# Patient Record
Sex: Male | Born: 1990 | Race: White | Hispanic: No | Marital: Single | Smoking: Never smoker
Health system: Southern US, Community
[De-identification: ages and names within clinical notes are randomized; demographics above are authoritative.]

---
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
## 2019-03-31 ENCOUNTER — Emergency Department (HOSPITAL_COMMUNITY)
Admission: EM | Admit: 2019-03-31 | Discharge: 2019-03-31 | Disposition: A | Discharge status: Home / Self Care | Payer: No Typology Code available for payment source | Attending: Emergency Medicine | Admitting: Emergency Medicine

## 2019-03-31 ENCOUNTER — Emergency Department (HOSPITAL_COMMUNITY): Payer: No Typology Code available for payment source

## 2019-03-31 ENCOUNTER — Encounter (HOSPITAL_COMMUNITY): Payer: Self-pay | Admitting: Obstetrics and Gynecology

## 2019-03-31 ENCOUNTER — Other Ambulatory Visit: Payer: Self-pay

## 2019-03-31 DIAGNOSIS — Y990 Civilian activity done for income or pay: Secondary | ICD-10-CM | POA: Diagnosis not present

## 2019-03-31 DIAGNOSIS — Y9289 Other specified places as the place of occurrence of the external cause: Secondary | ICD-10-CM | POA: Diagnosis not present

## 2019-03-31 DIAGNOSIS — Y9389 Activity, other specified: Secondary | ICD-10-CM | POA: Insufficient documentation

## 2019-03-31 DIAGNOSIS — W231XXA Caught, crushed, jammed, or pinched between stationary objects, initial encounter: Secondary | ICD-10-CM | POA: Insufficient documentation

## 2019-03-31 DIAGNOSIS — M79641 Pain in right hand: Secondary | ICD-10-CM | POA: Diagnosis not present

## 2019-03-31 NOTE — ED Triage Notes (Signed)
Patient reports he crushed his right hand between a gun safe.

## 2019-03-31 NOTE — ED Provider Notes (Signed)
[facility] DEPT Provider Note   CSN: [id number] Arrival date & time: 03/31/19  1534     History Chief Complaint  Patient presents with  . Hand Pain    Adam Wells is a 29 y.o. male.  29 y.o male with no PMH to the ED with a chief complaint of right hand pain status post injury.  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  He reports pain along the thenar region, this is worse with movement of the right thumb.  He has not tried any medication for improvement in his symptoms.  There is no obvious deformity or laceration present on his wound.  No alleviating factors.  No other trauma, weakness, or tingling sensation.   The history is provided by the patient.       History reviewed. No pertinent past medical history.  There are no problems to display for this patient.   History reviewed. No pertinent surgical history.     No family history on file.  Social History   Tobacco Use  . Smoking status: Never Smoker  . Smokeless tobacco: Never Used  Substance Use Topics  . Alcohol use: Not Currently  . Drug use: Not Currently    Home Medications Prior to Admission medications   Not on File    Allergies    Patient has no known allergies.  Review of Systems   Review of Systems  Constitutional: Negative for fever.  Musculoskeletal: Positive for arthralgias.    Physical Exam Updated Vital Signs BP (!) 142/93   Pulse 64   Temp 98.2 F (36.8 C) (Oral)   Resp 16   SpO2 100%   Physical Exam Vitals and nursing note reviewed.  Constitutional:      Appearance: He is well-developed.  HENT:     Head: Normocephalic and atraumatic.  Eyes:     General: No scleral icterus.    Pupils: Pupils are equal, round, and reactive to light.  Cardiovascular:     Heart sounds: Normal heart sounds.  Pulmonary:     Effort: Pulmonary  effort is normal.     Breath sounds: Normal breath sounds. No wheezing.  Chest:     Chest wall: No tenderness.  Abdominal:     General: Bowel sounds are normal. There is no distension.     Palpations: Abdomen is soft.     Tenderness: There is no abdominal tenderness.  Musculoskeletal:        General: No deformity.     Right hand: Tenderness and bony tenderness present. Normal range of motion. Normal strength. Normal sensation. There is no disruption of two-point discrimination. Normal capillary refill. Normal pulse.     Cervical back: Normal range of motion.     Comments: Pulses present, capillary refill is intact.  There is pain with palpation of the thenar region.  Pain with movement of the right thumb.  No snuffbox tenderness.  Skin:    General: Skin is warm and dry.  Neurological:     Mental Status: He is alert and oriented to person, place, and time.     ED Results / Procedures / Treatments   Labs (all labs ordered are listed, but only abnormal results are displayed) Labs Reviewed - No data to display  EKG None  Radiology DG Hand Complete Right  Result Date: 03/31/2019 CLINICAL  DATA:  Right hand pain/injury EXAM: RIGHT HAND - COMPLETE 3+ VIEW COMPARISON:  None. FINDINGS: No fracture or dislocation is seen. The joint spaces are preserved. Visualized soft tissues are within normal limits. IMPRESSION: Negative. Electronically Signed   By: Charline Bills M.D.   On: 03/31/2019 16:31    Procedures Procedures (including critical care time)  Medications Ordered in ED Medications - No data to display  ED Course  I have reviewed the triage vital signs and the nursing notes.  Pertinent labs & imaging results that were available during my care of the patient were reviewed by me and considered in my medical decision making (see chart for details).    MDM Rules/Calculators/A&P    Patient with a pertinent past medical history presented to ED status post right hand injury  while at work.  Patient reports pain along the thenar region, this is worse with flexion along with movement of his right thumb.  No snuffbox tenderness.  Pulses are present, capillary refill is intact.  He is neurovascularly intact.  Interpretation of his right hand x-ray showed no fracture, dislocation, soft tissue swelling.  Suspect a likely bruised from prior injury without any obvious deformity or laceration noted.  Will be placed on a Velcro splint to help with his symptoms.  He will benefit from some anti-inflammatories to help with his symptoms.  Return precautions discussed at length.  Patient understands and agrees to management.  Portions of this note were generated with Scientist, clinical (histocompatibility and immunogenetics). Dictation errors may occur despite best attempts at proofreading. Final Clinical Impression(s) / ED Diagnoses Final diagnoses:  Right hand pain    Rx / DC Orders ED Discharge Orders    None       Claude Manges, PA-C 03/31/19 1706    Sabas Sous, MD 04/01/19 [phone]

## 2019-03-31 NOTE — Discharge Instructions (Addendum)
Your x-ray today did not show any fracture or dislocation.  You may take some over-the-counter Aleve to help with your symptoms, this is an time for amatory, please take this with food.  Keep your right hand elevated along with place ice to the area which should help with your symptoms.

## 2019-06-12 ENCOUNTER — Other Ambulatory Visit: Payer: Self-pay

## 2019-06-12 ENCOUNTER — Ambulatory Visit (INDEPENDENT_AMBULATORY_CARE_PROVIDER_SITE_OTHER): Payer: Self-pay | Admitting: Psychiatry

## 2019-06-12 ENCOUNTER — Encounter: Payer: Self-pay | Admitting: Psychiatry

## 2019-06-12 VITALS — BP 139/78 | HR 59 | Ht 72.0 in | Wt 310.0 lb

## 2019-06-12 DIAGNOSIS — F410 Panic disorder [episodic paroxysmal anxiety] without agoraphobia: Secondary | ICD-10-CM

## 2019-06-12 DIAGNOSIS — F4010 Social phobia, unspecified: Secondary | ICD-10-CM

## 2019-06-12 MED ORDER — LORAZEPAM 0.5 MG PO TABS: ORAL_TABLET | ORAL | 1 refills | Status: DC

## 2019-06-12 NOTE — Progress Notes (Signed)
Adam Wells [id number] 1990/11/14 29 y.o.  Subjective:   Patient ID:  Adam Wells is a 29 y.o. (DOB 1990-03-02) male.  Chief Complaint:  Chief Complaint  Patient presents with  . Panic Attack  . Anxiety    HPI Adam Wells presents to the office today for follow-up of anxiety.  Patient was last seen in office on July 11, 2017.  He reports that since last visit he has not had any significant anxiety or depressive s/s until recently.   He reports that he took a new job last week and had recurrence of anxiety.  He reports having severe anxiety and panic on his second day of work and was not able to work last Thursday, Friday, and this Monday due to anxiety.  He reports that last Wednesday was the worst period of panic and felt frozen and unable to get up "but felt all this energy moving through me." He reports that he has had worry and panic since starting new job last week to include chest tightness and stomach pain. He reports that he has had difficulty getting to work and eating breakfast. He reports that "yesterday seemed to be kind of a turning point" and was able to get up and get moving. He was able to stay the entire day.    He reports that he had some anxiety this morning without panic. He reports that he is less anxious today because he has an idea of what his day looks like. Has had negative job experiences in the past. 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 Reports that he prefers hand on tasks instead of sitting around or dealing with customers. He reports that on weekends he prefers to be moving and doing something with his hands. 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   He reports that he had some mild depression last week "because I was so frustrated with myself." He reports that he is sleeping well because he is physically tired at night. Had difficulty falling asleep one night last week. Appetite was decreased all  last week and appetite has been returning this week. Motivation has been low the last 1-2 weeks, even for things that he really enjoys. Energy has been "medium...mellow." Concentration has varied. Reports that some days he is sitting on the couch and not watching what is on TV and other days concentration is ok. Overall concentration has been less. Denies SI.   He reports that he has never liked large gatherings. His mother has told him that as a child he never wanted a birthday party and did not want to attend birthday parties. Has some anxiety around people he does not know well. He does not like larger gatherings of people he knows well.   Works for [employer]." Has been with girlfriend almost 7 years. She lives in [place] and he lives in the [place] area.   Past Psychiatric Medication Trials: Lexapro- Minimal improvement. Jitteriness at 15 mg po qd Trintellix Lorazepam    Review of Systems:  Review of Systems  Musculoskeletal: Negative for gait problem.  Neurological: Negative for tremors.  Psychiatric/Behavioral:       Please refer to HPI    Medications: I have reviewed the patient's current medications.  Current Outpatient Medications  Medication Sig Dispense Refill  . LORazepam (ATIVAN) 0.5 MG tablet Take 1/2-1 tab po TID prn anxiety 90 tablet 1   No current facility-administered medications  for this visit.    Medication Side Effects: Other: N/A  Allergies: No Known Allergies  History reviewed. No pertinent past medical history.  History reviewed. No pertinent family history.  Social History   Socioeconomic History  . Marital status: Single    Spouse name: Not on file  . Number of children: Not on file  . Years of education: Not on file  . Highest education level: Not on file  Occupational History  . Not on file  Tobacco Use  . Smoking status: Never Smoker  . Smokeless tobacco: Never Used  Substance and Sexual Activity  . Alcohol use: Not Currently   . Drug use: Not Currently  . Sexual activity: Yes  Other Topics Concern  . Not on file  Social History Narrative  . Not on file   Social Determinants of Health   Financial Resource Strain:   . Difficulty of Paying Living Expenses:   Food Insecurity:   . Worried About Programme researcher, broadcasting/film/video in the Last Year:   . Barista in the Last Year:   Transportation Needs:   . Freight forwarder (Medical):   Marland Kitchen Lack of Transportation (Non-Medical):   Physical Activity:   . Days of Exercise per Week:   . Minutes of Exercise per Session:   Stress:   . Feeling of Stress :   Social Connections:   . Frequency of Communication with Friends and Family:   . Frequency of Social Gatherings with Friends and Family:   . Attends Religious Services:   . Active Member of Clubs or Organizations:   . Attends Banker Meetings:   Marland Kitchen Marital Status:   Intimate Partner Violence:   . Fear of Current or Ex-Partner:   . Emotionally Abused:   Marland Kitchen Physically Abused:   . Sexually Abused:     Past Medical History, Surgical history, Social history, and Family history were reviewed and updated as appropriate.   Please see review of systems for further details on the patient's review from today.   Objective:   Physical Exam:  BP 139/78   Pulse (!) 59   Ht 6' (1.829 m)   Wt (!) 310 lb (140.6 kg)   BMI 42.04 kg/m   Physical Exam Constitutional:      General: He is not in acute distress. Musculoskeletal:        General: No deformity.  Neurological:     Mental Status: He is alert and oriented to person, place, and time.     Coordination: Coordination normal.  Psychiatric:        Attention and Perception: Attention and perception normal. He does not perceive auditory or visual hallucinations.        Mood and Affect: Mood is anxious. Mood is not depressed. Affect is not labile, blunt, angry or inappropriate.        Speech: Speech normal.        Behavior: Behavior normal.         Thought Content: Thought content normal. Thought content is not paranoid or delusional. Thought content does not include homicidal or suicidal ideation. Thought content does not include homicidal or suicidal plan.        Cognition and Memory: Cognition and memory normal.        Judgment: Judgment normal.     Comments: Insight intact     Lab Review:  No results found for: NA, K, CL, CO2, GLUCOSE, BUN, CREATININE, CALCIUM, PROT, ALBUMIN, AST, ALT, ALKPHOS,  BILITOT, GFRNONAA, GFRAA  No results found for: WBC, RBC, HGB, HCT, PLT, MCV, MCH, MCHC, RDW, LYMPHSABS, MONOABS, EOSABS, BASOSABS  No results found for: POCLITH, LITHIUM   No results found for: PHENYTOIN, PHENOBARB, VALPROATE, CBMZ   .res Assessment: Plan:   Discussed possible treatment options with patient, to include starting a maintenance medication or using lorazepam short-term for acute anxiety.  Reviewed past history and that patient has had adverse effects and minimal improvement with both Lexapro and Trintellix, and anxiety and panic improved after starting brief course of lorazepam and mood and anxiety signs and symptoms have been in remission for almost 2 years without any medication.  Discussed therefore, restarting lorazepam for anxiety and panic.  Discussed that he could take lorazepam 1/2 to 1 tablet 3 times daily over the next several days until anxiety stabilizes and then could transition to taking lorazepam only in the morning, which is when his anxiety and panic is typically the most severe, and then transitioning to using lorazepam only as needed. Discussed potential benefits, risk, and side effects of benzodiazepines to include potential risk of tolerance and dependence, as well as possible drowsiness.  Advised patient not to drive if experiencing drowsiness and to take lowest possible effective dose to minimize risk of dependence and tolerance. Patient agrees to trial of lorazepam.  Will start lorazepam 0.5 mg 1/2 to 1 tablet  3 times daily as needed for anxiety. Recommend continuing psychotherapy with Bradley Ferris, LPC. Patient to follow-up with this provider as needed. Patient advised to contact office with any questions, adverse effects, or acute worsening in signs and symptoms.  [place] was seen today for panic attack and anxiety.  Diagnoses and all orders for this visit:  Social anxiety disorder -     LORazepam (ATIVAN) 0.5 MG tablet; Take 1/2-1 tab po TID prn anxiety  Panic disorder     Please see After Visit Summary for patient specific instructions.  No future appointments.  No orders of the defined types were placed in this encounter.   -------------------------------

## 2020-02-04 ENCOUNTER — Other Ambulatory Visit: Payer: Self-pay | Admitting: Psychiatry

## 2020-02-04 DIAGNOSIS — F4010 Social phobia, unspecified: Secondary | ICD-10-CM

## 2020-04-17 ENCOUNTER — Other Ambulatory Visit: Payer: Self-pay

## 2020-04-17 ENCOUNTER — Ambulatory Visit (INDEPENDENT_AMBULATORY_CARE_PROVIDER_SITE_OTHER): Payer: BLUE CROSS/BLUE SHIELD | Admitting: Psychiatry

## 2020-04-17 ENCOUNTER — Encounter: Payer: Self-pay | Admitting: Psychiatry

## 2020-04-17 DIAGNOSIS — F4010 Social phobia, unspecified: Secondary | ICD-10-CM

## 2020-04-17 MED ORDER — CLONAZEPAM 0.5 MG PO TABS: 0.5000 mg | ORAL_TABLET | Freq: Three times a day (TID) | ORAL | 0 refills | Status: DC | PRN

## 2020-04-17 NOTE — Progress Notes (Signed)
Adam Wells [id number] December 16, 1990 30 y.o.  Subjective:   Patient ID:  Adam Wells is a 30 y.o. (DOB 1990-03-21) male.  Chief Complaint:  Chief Complaint  Patient presents with  . Anxiety    HPI Adam Wells presents to the office today emergently for treatment of anxiety. He reports that he started having increased anxiety in January and February. He reports that last month he was taking Lorazepam 0.5 mg 4-5 times a day. He went to a large event for fishing and had severe anxiety and hyper-vigilance. He almost had a panic attack about a week ago when his fiance wanted him to spend money on something that he felt he could not afford.   He is getting married next month and has anxiety in anticipation of being infront of 75 people. He reports that it is planned to be a 4 hour event. Has anxiety about finances, the future, the honey moon, where they will live, etc. He reports that he will get jittery with anxiety.   He reports that for awhile he would rarely take Lorazepam and anxiety was mostly controlled. He reports that Lorazepam has not been as effective for him recently.   He reports that he has had some slight depression in the last few weeks. He has been sleeping ok for the most part. He reports that there have been a few nights where he felt like he was in a light sleep. Appetite has been ok. Energy is ok at work. He reports that he is very tried at the end of the day and sometimes is falling asleep on the couch. Motivation is ok for work. He reports that he has not been fishing recently. He notices some difficulty with concentration and may forget what he was about to do. Denies SI.   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    Past Psychiatric Medication Trials: Lexapro- Minimal improvement. Jitteriness at 15 mg po qd Prozac- Made anxiety worse. Had difficulty leaving home. Trintellix Lorazepam   Review of Systems:  Review of Systems  Musculoskeletal:  Negative for gait problem.       Occ back pain after heavy lifting  Neurological: Negative for tremors.  Psychiatric/Behavioral:       Please refer to HPI    Medications: I have reviewed the patient's current medications.  Current Outpatient Medications  Medication Sig Dispense Refill  . clonazePAM (KLONOPIN) 0.5 MG tablet Take 1 tablet (0.5 mg total) by mouth 3 (three) times daily as needed for anxiety. 90 tablet 0  . LORazepam (ATIVAN) 0.5 MG tablet TAKE ONE HALF OF A TABLET TO ONE TABLET BY MOUTH THREE TIMES A DAY AS NEEDED FOR ANXIETY 90 tablet 0   No current facility-administered medications for this visit.    Medication Side Effects: None  Allergies: No Known Allergies  History reviewed. No pertinent past medical history.  History reviewed. No pertinent family history.  Social History   Socioeconomic History  . Marital status: Single    Spouse name: Not on file  . Number of children: Not on file  . Years of education: Not on file  . Highest education level: Not on file  Occupational History  . Not on file  Tobacco Use  . Smoking status: Never Smoker  . Smokeless tobacco: Never Used  Vaping Use  . Vaping Use: Never used  Substance and Sexual Activity  . Alcohol use: Not Currently  . Drug use: Not Currently  . Sexual  activity: Yes  Other Topics Concern  . Not on file  Social History Narrative  . Not on file   Social Determinants of Health   Financial Resource Strain: Not on file  Food Insecurity: Not on file  Transportation Needs: Not on file  Physical Activity: Not on file  Stress: Not on file  Social Connections: Not on file  Intimate Partner Violence: Not on file    Past Medical History, Surgical history, Social history, and Family history were reviewed and updated as appropriate.   Please see review of systems for further details on the patient's review from today.   Objective:   Physical Exam:  There were no vitals taken for this  visit.  Physical Exam Constitutional:      General: He is not in acute distress. Musculoskeletal:        General: No deformity.  Neurological:     Mental Status: He is alert and oriented to person, place, and time.     Coordination: Coordination normal.  Psychiatric:        Attention and Perception: Attention and perception normal. He does not perceive auditory or visual hallucinations.        Mood and Affect: Mood is anxious and depressed. Affect is not labile, blunt, angry or inappropriate.        Speech: Speech normal.        Behavior: Behavior normal.        Thought Content: Thought content normal. Thought content is not paranoid or delusional. Thought content does not include homicidal or suicidal ideation. Thought content does not include homicidal or suicidal plan.        Cognition and Memory: Cognition and memory normal.        Judgment: Judgment normal.     Comments: Insight intact     Lab Review:  No results found for: NA, K, CL, CO2, GLUCOSE, BUN, CREATININE, CALCIUM, PROT, ALBUMIN, AST, ALT, ALKPHOS, BILITOT, GFRNONAA, GFRAA  No results found for: WBC, RBC, HGB, HCT, PLT, MCV, MCH, MCHC, RDW, LYMPHSABS, MONOABS, EOSABS, BASOSABS  No results found for: POCLITH, LITHIUM   No results found for: PHENYTOIN, PHENOBARB, VALPROATE, CBMZ   .res Assessment: Plan:   Patient seen for 30 minutes of time spent counseling patient regarding possible treatment options for anxiety.  Discussed potential benefits, risks, and side effects of Klonopin and gabapentin.  Recommend changing Ativan to a different benzodiazepine since Ativan is no longer effective, even when patient is taking a 1 mg dose.  Discussed switching to Klonopin since it has a longer duration and may be more effective at lowering overall level of chronic anxiety.  Encourage patient to take Klonopin 3 times daily as his schedule allows over the next several days to lower overall level of anxiety.  Discussed that he could  then take Klonopin more as needed. Discussed considering initiation of gabapentin for social anxiety if Klonopin does not fully control anxiety signs and symptoms. Patient to follow-up in approximately 2 weeks or sooner if clinically indicated. Patient advised to contact office with any questions, adverse effects, or acute worsening in signs and symptoms. Recommend continuing psychotherapy with Bradley Ferris, LC [facility].  [place] was seen today for anxiety.  Diagnoses and all orders for this visit:  Social anxiety disorder -     clonazePAM (KLONOPIN) 0.5 MG tablet; Take 1 tablet (0.5 mg total) by mouth 3 (three) times daily as needed for anxiety.     Please see After Visit Summary for patient specific  instructions.  Future Appointments  Date Time Provider Department Center  04/28/2020 11:45 AM Corie Chiquito, PMHNP [facility] None    No orders of the defined types were placed in this encounter.   -------------------------------

## 2020-04-21 ENCOUNTER — Telehealth: Payer: Self-pay | Admitting: Psychiatry

## 2020-04-21 DIAGNOSIS — F4010 Social phobia, unspecified: Secondary | ICD-10-CM

## 2020-04-21 MED ORDER — GABAPENTIN 300 MG PO CAPS: 300.0000 mg | ORAL_CAPSULE | Freq: Three times a day (TID) | ORAL | 1 refills | Status: DC

## 2020-04-21 MED ORDER — PROPRANOLOL HCL 10 MG PO TABS: ORAL_TABLET | ORAL | 1 refills | Status: DC

## 2020-04-21 NOTE — Telephone Encounter (Signed)
Pt called to report new med Clonazepam is not agreeing with him. Changes his attitude and mood. Would like to try something else. [facility]. Contact # [phone]

## 2020-04-21 NOTE — Telephone Encounter (Signed)
Returned call to pt. He reports that he has experienced increased depression and anxiety since changing to Klonopin. Has had increasing muscle tension. He reports that he stopped taking it last night. He reports that he feels better today.   He reports that he is continuing to have constant anxiety. He reports that he did not want to be around people yesterday.   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  Discussed potential benefits, risks, and side effects of Gabapentin and Propranolol.   Will start Gabapentin 300 mg TID for anxiety.   Will start Propranolol 10 mg 1-2 tabs po BID prn anxiety.   Patient advised to contact office with any questions, adverse effects, or acute worsening in signs and symptoms.'

## 2020-04-28 ENCOUNTER — Encounter: Payer: Self-pay | Admitting: Psychiatry

## 2020-04-28 ENCOUNTER — Ambulatory Visit (INDEPENDENT_AMBULATORY_CARE_PROVIDER_SITE_OTHER): Payer: BLUE CROSS/BLUE SHIELD | Admitting: Psychiatry

## 2020-04-28 ENCOUNTER — Other Ambulatory Visit: Payer: Self-pay

## 2020-04-28 VITALS — BP 151/79 | HR 52

## 2020-04-28 DIAGNOSIS — F4010 Social phobia, unspecified: Secondary | ICD-10-CM

## 2020-04-28 DIAGNOSIS — F410 Panic disorder [episodic paroxysmal anxiety] without agoraphobia: Secondary | ICD-10-CM

## 2020-04-28 MED ORDER — LORAZEPAM 2 MG PO TABS: ORAL_TABLET | ORAL | 0 refills | Status: DC

## 2020-04-28 NOTE — Progress Notes (Signed)
Adam Wells [id number] [birth year] 30 y.o.  Subjective:   Patient ID:  Adam Wells is a 30 y.o. (DOB 28-May-1990) male.  Chief Complaint:  Chief Complaint  Patient presents with  . Anxiety    HPI Adam Wells presents to the office today for follow-up of anxiety. He reports that Gabapentin has not been helpful for his anxiety and may be "making it worse" and is also causing fatigue. He continues to experience generalized anxiety and social anxiety. Denies panic attacks. Has increased anxiety with discussions about wedding for 05/22/20. Some periods of increased heart rate, shakiness, muscle tension, etc.   He reports that he is experiencing increased depression. Low energy and motivation. He reports that his sleep has been disrupted and at times cannot fall asleep. He reports restless sleep. Appetite is fair. Difficulty with concentration. Denies SI.   Mood has been worse the days he has taken Gabapentin. Has not tried Propranolol.   Was able to enjoy his bachelor trip with friends with only anxiety being when they went to a restaurant.   Sees Bradley Ferris, LCAS this Thursday.     Past Psychiatric Medication Trials: Lexapro- Minimal improvement. Jitteriness at 15 mg po qd Prozac- Made anxiety worse. Had difficulty leaving home. Trintellix Gabapentin- Not effective for anxiety and caused fatigue Lorazepam-Tolerated better than other Klonopin- adverse effects  Review of Systems:  Review of Systems  Musculoskeletal: Negative for gait problem.  Neurological: Negative for tremors.  Psychiatric/Behavioral:       Please refer to HPI    Medications: I have reviewed the patient's current medications.  Current Outpatient Medications  Medication Sig Dispense Refill  . LORazepam (ATIVAN) 2 MG tablet Take 1/2-1 tab po TID prn anxiety 90 tablet 0   No current facility-administered medications for this visit.    Medication Side Effects: Fatigue Possible worsening  depression  Allergies: No Known Allergies  History reviewed. No pertinent past medical history.  History reviewed. No pertinent family history.  Social History   Socioeconomic History  . Marital status: Single    Spouse name: Not on file  . Number of children: Not on file  . Years of education: Not on file  . Highest education level: Not on file  Occupational History  . Not on file  Tobacco Use  . Smoking status: Never Smoker  . Smokeless tobacco: Never Used  Vaping Use  . Vaping Use: Never used  Substance and Sexual Activity  . Alcohol use: Not Currently  . Drug use: Not Currently  . Sexual activity: Yes  Other Topics Concern  . Not on file  Social History Narrative  . Not on file   Social Determinants of Health   Financial Resource Strain: Not on file  Food Insecurity: Not on file  Transportation Needs: Not on file  Physical Activity: Not on file  Stress: Not on file  Social Connections: Not on file  Intimate Partner Violence: Not on file    Past Medical History, Surgical history, Social history, and Family history were reviewed and updated as appropriate.   Please see review of systems for further details on the patient's review from today.   Objective:   Physical Exam:  BP (!) 151/79   Pulse (!) 52   Physical Exam Neurological:     Mental Status: He is alert and oriented to person, place, and time.     Cranial Nerves: No dysarthria.  Psychiatric:        Attention and Perception: Attention and  perception normal.        Mood and Affect: Mood is anxious.        Speech: Speech normal.        Behavior: Behavior is cooperative.        Thought Content: Thought content normal. Thought content is not paranoid or delusional. Thought content does not include homicidal or suicidal ideation. Thought content does not include homicidal or suicidal plan.        Cognition and Memory: Cognition and memory normal.        Judgment: Judgment normal.     Comments:  Insight intact     Lab Review:  No results found for: NA, K, CL, CO2, GLUCOSE, BUN, CREATININE, CALCIUM, PROT, ALBUMIN, AST, ALT, ALKPHOS, BILITOT, GFRNONAA, GFRAA  No results found for: WBC, RBC, HGB, HCT, PLT, MCV, MCH, MCHC, RDW, LYMPHSABS, MONOABS, EOSABS, BASOSABS  No results found for: POCLITH, LITHIUM   No results found for: PHENYTOIN, PHENOBARB, VALPROATE, CBMZ   .res Assessment: Plan:    Baxter was seen today for anxiety.  Diagnoses and all orders for this visit:  Social anxiety disorder -     LORazepam (ATIVAN) 2 MG tablet; Take 1/2-1 tab po TID prn anxiety  Panic disorder -     LORazepam (ATIVAN) 2 MG tablet; Take 1/2-1 tab po TID prn anxiety      Patient seen for 30 minutes and time spent counseling patient regarding possible treatment options, to include sertraline.  Patient reports that he would prefer to resume Ativan since this is the only medication that has provided some relief from the acute anxiety and not cause any adverse effects.  Discussed taking higher amounts of Ativan to control acute situational anxiety short-term.  Will start Ativan 2 mg 1/2 to 1 tablet 3 times daily as needed for acute anxiety. Recommend continuing psychotherapy with Bradley Ferris, LCAS. Patient advised to contact office with any questions, adverse effects, or acute worsening in signs and symptoms.    Please see After Visit Summary for patient specific instructions.  Future Appointments  Date Time Provider Department Center  05/07/2020 12:00 PM Corie Chiquito, PMHNP [facility] None    No orders of the defined types were placed in this encounter.   -------------------------------

## 2020-05-07 ENCOUNTER — Ambulatory Visit: Payer: BLUE CROSS/BLUE SHIELD | Admitting: Psychiatry

## 2020-07-03 ENCOUNTER — Telehealth: Payer: Self-pay | Admitting: Psychiatry

## 2020-07-03 ENCOUNTER — Other Ambulatory Visit: Payer: Self-pay

## 2020-07-03 DIAGNOSIS — F410 Panic disorder [episodic paroxysmal anxiety] without agoraphobia: Secondary | ICD-10-CM

## 2020-07-03 DIAGNOSIS — F4010 Social phobia, unspecified: Secondary | ICD-10-CM

## 2020-07-03 MED ORDER — LORAZEPAM 2 MG PO TABS: ORAL_TABLET | ORAL | 0 refills | Status: AC

## 2020-07-03 NOTE — Telephone Encounter (Signed)
Ok to send refills? 

## 2020-07-03 NOTE — Telephone Encounter (Signed)
Patient lm stating cancel his upcoming appt on 5/31 that it is no longer needed. He is requesting that his medication still be refilled. He was last seen in the office on 3/22.

## 2020-07-07 ENCOUNTER — Ambulatory Visit: Payer: BLUE CROSS/BLUE SHIELD | Admitting: Psychiatry

## 2021-02-27 IMAGING — CR DG HAND COMPLETE 3+V*R*
3 series · 3 of 3 positions shown · non-contrast
Comparison: None.

CLINICAL DATA: Right hand pain/injury

EXAM:
RIGHT HAND - COMPLETE 3+ VIEW

[x hand pa right]
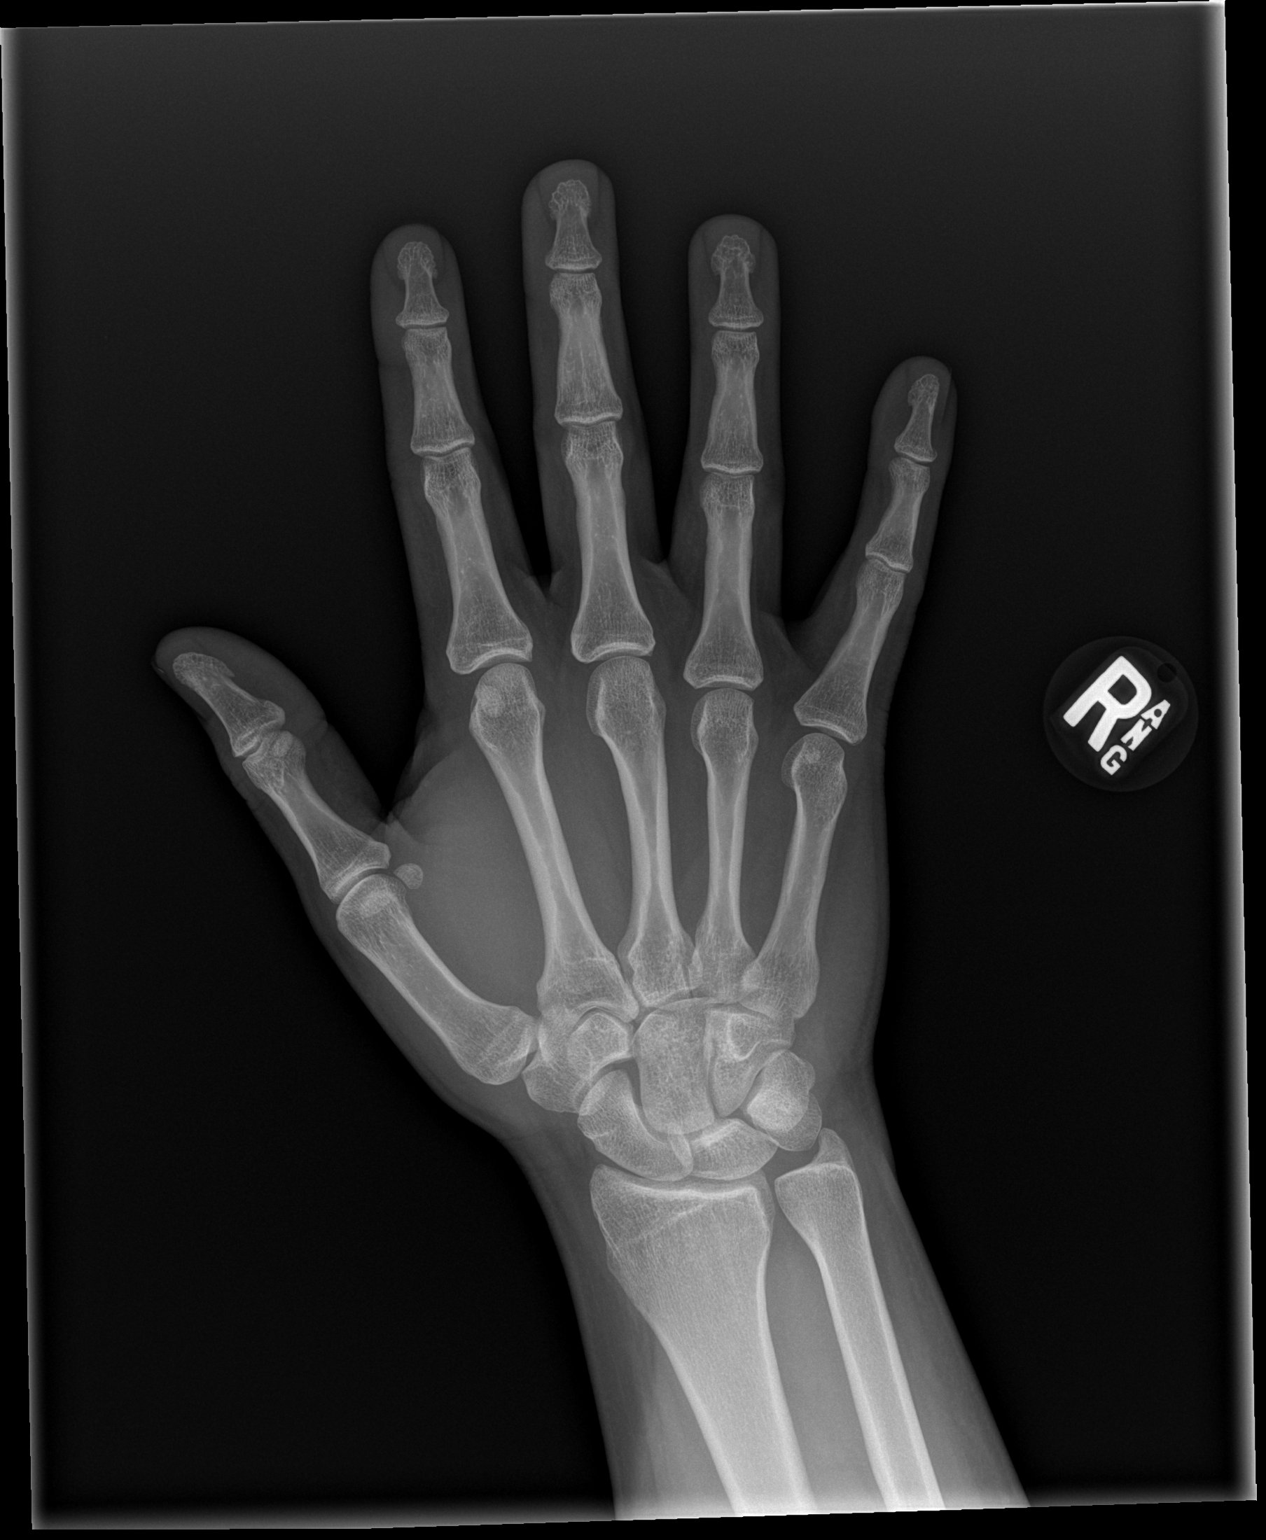

[x hand obl right]
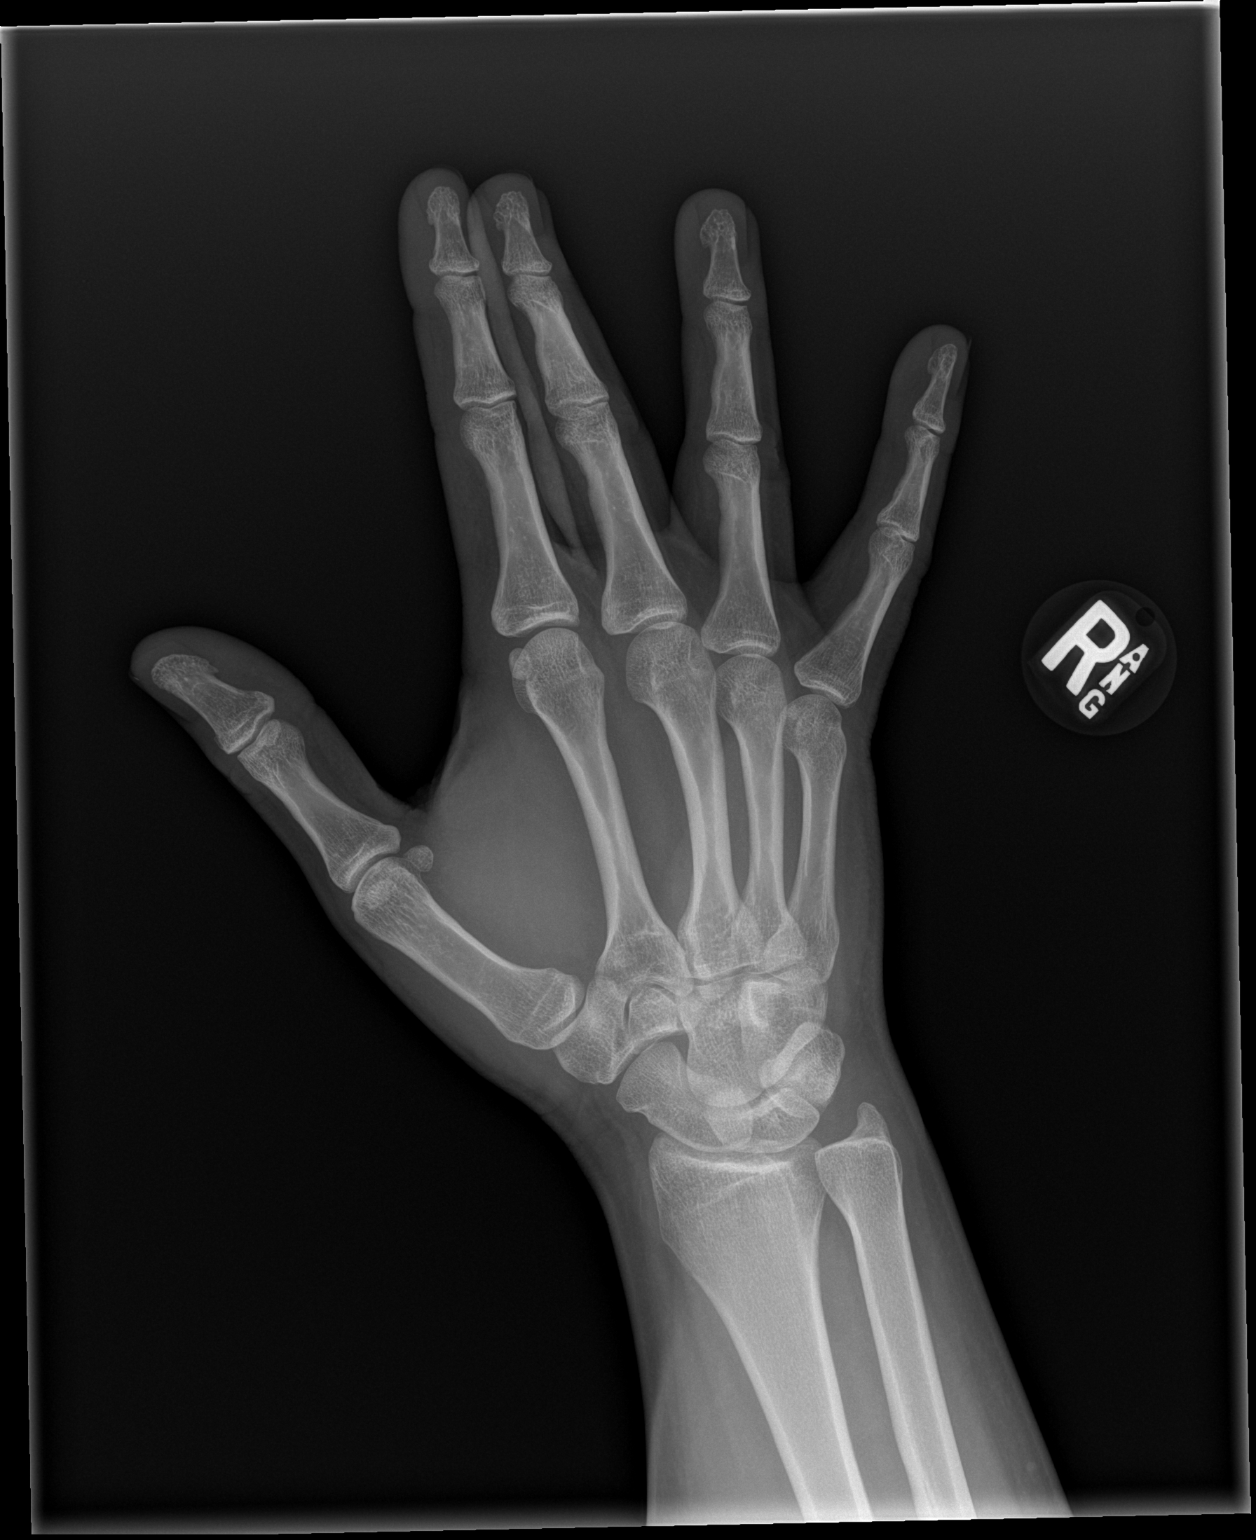

[x hand lat right]
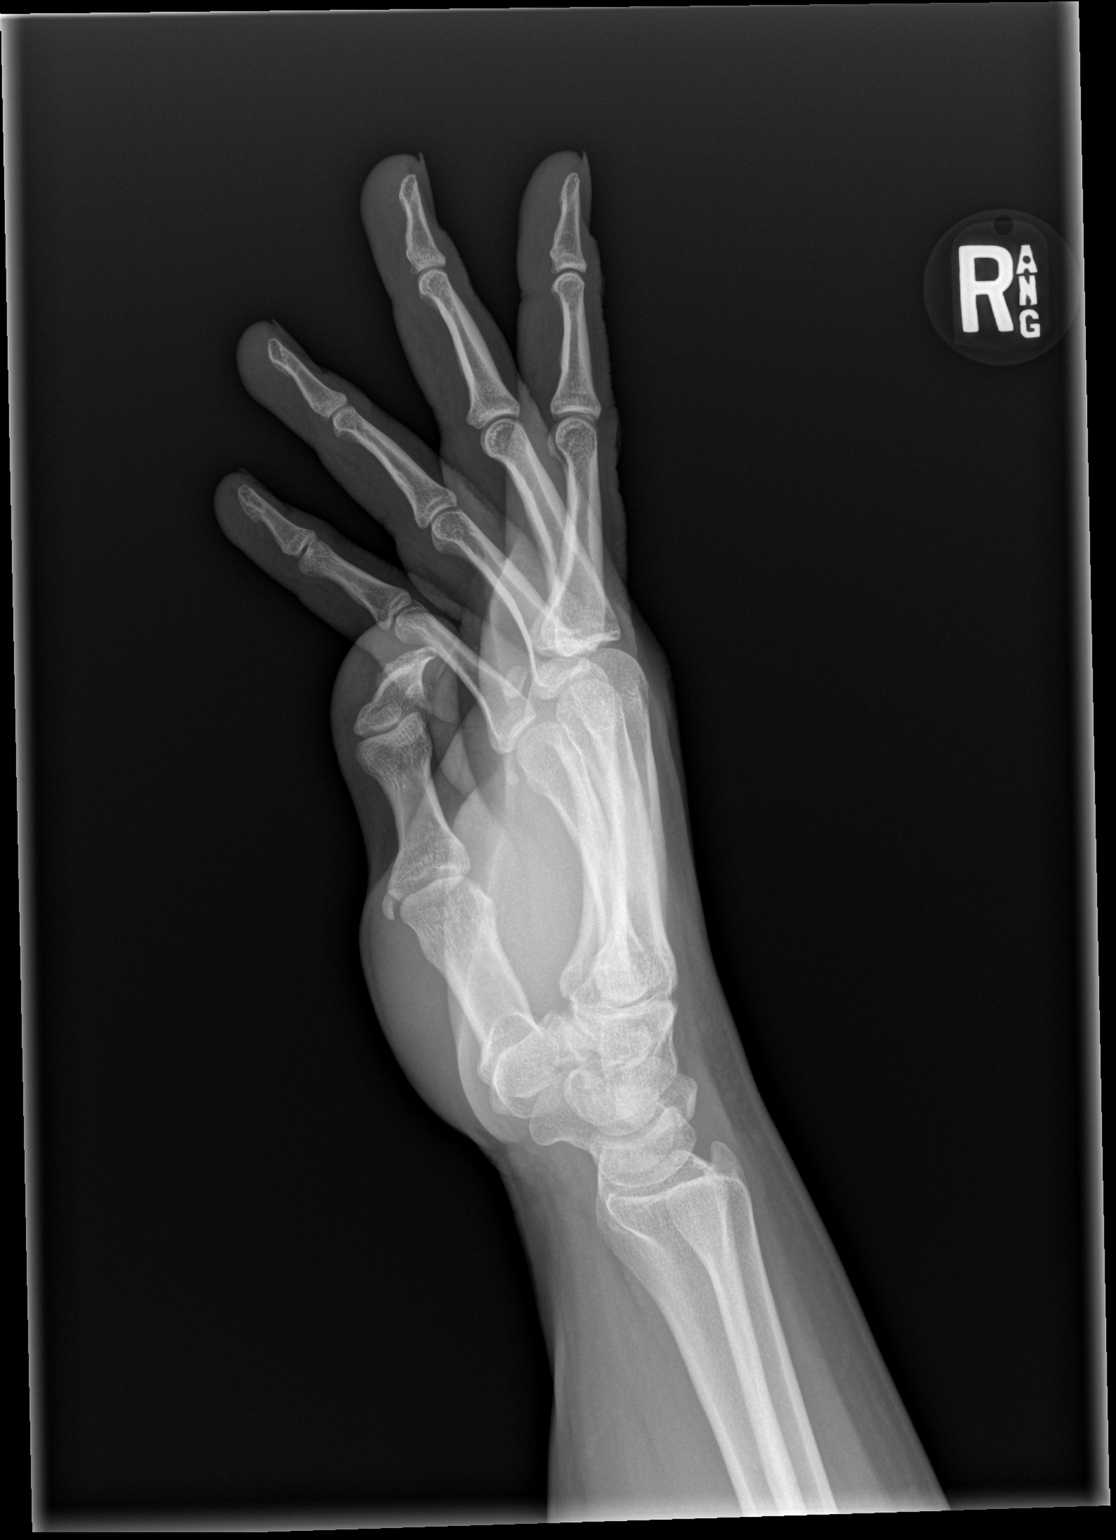

[3 of 3 positions shown; findings below may reference images not displayed]

FINDINGS: No fracture or dislocation is seen.

The joint spaces are preserved.

Visualized soft tissues are within normal limits.
IMPRESSION: Negative.

## 2021-06-07 ENCOUNTER — Telehealth: Payer: Self-pay | Admitting: Psychiatry

## 2021-06-07 NOTE — Telephone Encounter (Signed)
Returned call to pt. 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 He reports that he has had a few (4-5) panic attacks a couple months ago with several stressors and limited sleep. 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 He reports that he has not had any panic attacks in the last month. He has not taken Ativan in awhile and does not take it when he knows he will be driving. He has been seeing Bradley Ferris, LCAS to help manage anxiety. 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  ? ?He reports that he his work has requested more information. ? ?Discussed recommendation that he be allowed to take a break if panic attacks occur. He reports that the longest panic attack he has had was 2 hours. He reports that he has called out about 2-3 times in the last 1-2 months due to anxiety. He may need 2-3 episodes of being excused from work in a month with a duration of 1 hour up to 1 day.  ? ?Discussed that an ROI would be needed to discuss information with employer. Pt verbalized understanding. Discussed he could drop off paperwork and sign ROI.  ?

## 2021-06-07 NOTE — Telephone Encounter (Signed)
FYI.Marland KitchenMarland KitchenRight after the pt called, his employer called. He asked for [place] to send the ROI form to them, which I have done just a few mins ago. I advised 7-10 working days for completion.  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 ? ? ?Adam Wells at [facility] ?Phone [phone] ?Fax [phone] ? ?

## 2021-06-07 NOTE — Telephone Encounter (Signed)
Pt cancelled asked for Shanda Bumps to call him back about a workplace issue. He had an appt on 5/3, but has cancelled and said he would reschedule it after talking to her. ? ?Pls call him at [phone] ?

## 2021-06-09 ENCOUNTER — Ambulatory Visit: Payer: BLUE CROSS/BLUE SHIELD | Admitting: Psychiatry

## 2022-11-14 ENCOUNTER — Telehealth: Payer: Self-pay | Admitting: Psychiatry

## 2022-11-14 NOTE — Telephone Encounter (Signed)
Please call patient and ask if he can send [place] a copy of the jury summons and date of jury duty.

## 2022-11-14 NOTE — Telephone Encounter (Signed)
Patient called needing a letter for jury duty. Ph: [phone] No upcoming appt

## 2022-11-15 NOTE — Telephone Encounter (Signed)
Jury summons received. Please schedule follow-up appointment since pt was last seen 04/28/20 and will need current information to complete letter for jury duty.

## 2022-11-15 NOTE — Telephone Encounter (Signed)
Patient dropped off jury summons. Placed in [name]'s box

## 2022-11-15 NOTE — Telephone Encounter (Signed)
Patient will drop off jury summons some time today

## 2022-11-15 NOTE — Telephone Encounter (Signed)
Lvm for pt to cal back and schedule appt

## 2022-11-17 ENCOUNTER — Encounter: Payer: Self-pay | Admitting: Psychiatry

## 2022-11-17 ENCOUNTER — Ambulatory Visit (INDEPENDENT_AMBULATORY_CARE_PROVIDER_SITE_OTHER): Payer: 59 | Admitting: Psychiatry

## 2022-11-17 DIAGNOSIS — F410 Panic disorder [episodic paroxysmal anxiety] without agoraphobia: Secondary | ICD-10-CM

## 2022-11-17 DIAGNOSIS — F4010 Social phobia, unspecified: Secondary | ICD-10-CM
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
## 2022-12-21 ENCOUNTER — Encounter: Payer: Self-pay | Admitting: Psychiatry
